# Patient Record
Sex: Female | Born: 1955 | Race: White | Hispanic: No | Marital: Married | Smoking: Never smoker
Health system: Southern US, Community
[De-identification: ages and names within clinical notes are randomized; demographics above are authoritative.]

---

## 1997-12-10 ENCOUNTER — Other Ambulatory Visit: Admission: RE | Admit: 1997-12-10 | Discharge: 1997-12-10 | Payer: Self-pay | Admitting: Obstetrics and Gynecology

## 1998-05-12 ENCOUNTER — Ambulatory Visit (HOSPITAL_COMMUNITY): Admission: RE | Admit: 1998-05-12 | Discharge: 1998-05-12 | Payer: Self-pay | Admitting: Obstetrics and Gynecology

## 1999-05-12 ENCOUNTER — Other Ambulatory Visit: Admission: RE | Admit: 1999-05-12 | Discharge: 1999-05-12 | Payer: Self-pay | Admitting: Obstetrics and Gynecology

## 1999-08-04 ENCOUNTER — Encounter: Payer: Self-pay | Admitting: Obstetrics and Gynecology

## 1999-08-04 ENCOUNTER — Ambulatory Visit (HOSPITAL_COMMUNITY): Admission: RE | Admit: 1999-08-04 | Discharge: 1999-08-04 | Payer: Self-pay | Admitting: Obstetrics and Gynecology

## 1999-10-18 ENCOUNTER — Other Ambulatory Visit: Admission: RE | Admit: 1999-10-18 | Discharge: 1999-10-18 | Payer: Self-pay | Admitting: Obstetrics and Gynecology

## 1999-10-18 ENCOUNTER — Ambulatory Visit (HOSPITAL_COMMUNITY): Admission: RE | Admit: 1999-10-18 | Discharge: 1999-10-18 | Payer: Self-pay | Admitting: Obstetrics and Gynecology

## 1999-10-25 ENCOUNTER — Encounter (INDEPENDENT_AMBULATORY_CARE_PROVIDER_SITE_OTHER): Payer: Self-pay

## 1999-10-25 ENCOUNTER — Ambulatory Visit (HOSPITAL_COMMUNITY): Admission: RE | Admit: 1999-10-25 | Discharge: 1999-10-25 | Payer: Self-pay | Admitting: Obstetrics and Gynecology

## 2001-12-17 ENCOUNTER — Ambulatory Visit (HOSPITAL_COMMUNITY): Admission: RE | Admit: 2001-12-17 | Discharge: 2001-12-17 | Payer: Self-pay | Admitting: Obstetrics and Gynecology

## 2001-12-17 ENCOUNTER — Encounter: Payer: Self-pay | Admitting: Obstetrics and Gynecology

## 2003-08-14 ENCOUNTER — Emergency Department (HOSPITAL_COMMUNITY): Admission: EM | Admit: 2003-08-14 | Discharge: 2003-08-14 | Payer: Self-pay | Admitting: Emergency Medicine

## 2003-08-15 ENCOUNTER — Inpatient Hospital Stay (HOSPITAL_COMMUNITY): Admission: EM | Admit: 2003-08-15 | Discharge: 2003-08-19 | Payer: Self-pay | Admitting: Emergency Medicine

## 2003-08-17 ENCOUNTER — Encounter (INDEPENDENT_AMBULATORY_CARE_PROVIDER_SITE_OTHER): Payer: Self-pay | Admitting: Cardiology

## 2003-09-07 ENCOUNTER — Ambulatory Visit (HOSPITAL_COMMUNITY): Admission: RE | Admit: 2003-09-07 | Discharge: 2003-09-07 | Payer: Self-pay | Admitting: Neurology

## 2003-11-26 ENCOUNTER — Ambulatory Visit (HOSPITAL_COMMUNITY): Admission: RE | Admit: 2003-11-26 | Discharge: 2003-11-26 | Payer: Self-pay | Admitting: Internal Medicine

## 2003-11-26 ENCOUNTER — Encounter (INDEPENDENT_AMBULATORY_CARE_PROVIDER_SITE_OTHER): Payer: Self-pay | Admitting: *Deleted

## 2003-12-29 ENCOUNTER — Ambulatory Visit (HOSPITAL_COMMUNITY): Admission: RE | Admit: 2003-12-29 | Discharge: 2003-12-29 | Payer: Self-pay | Admitting: Obstetrics and Gynecology

## 2004-06-23 ENCOUNTER — Ambulatory Visit: Payer: Self-pay | Admitting: Family Medicine

## 2004-07-15 ENCOUNTER — Ambulatory Visit: Payer: Self-pay | Admitting: Family Medicine

## 2004-09-20 IMAGING — CT CT HEAD W/O CM
1 series · 16 of 26 positions shown, 20 images · non-contrast
Comparison: none

CLINICAL DATA: Follow-up stroke.
 CT OF THE HEAD WITHOUT CONTRAST 
 Routine unenhanced study was performed.  
 Comparison is made with a CT done on 08/17/03.
 There has been further evolution of the previously demonstrated subacute infarct in the right frontal lobe.  The associated hemorrhage has resolved, and there is less brain edema.  There is no evidence of acute stroke extension, recent hemorrhage or extraaxial fluid collection.   There is no longer any mass effect on the frontal horns, which are normal in size.  The visualized paranasal sinuses remain clear.

[Series 2: brain · axial · 0.47mm/px · z∈[+141,+258]mm · 16 of 26 slices shown, 20 images]
[im 2/26  brain]
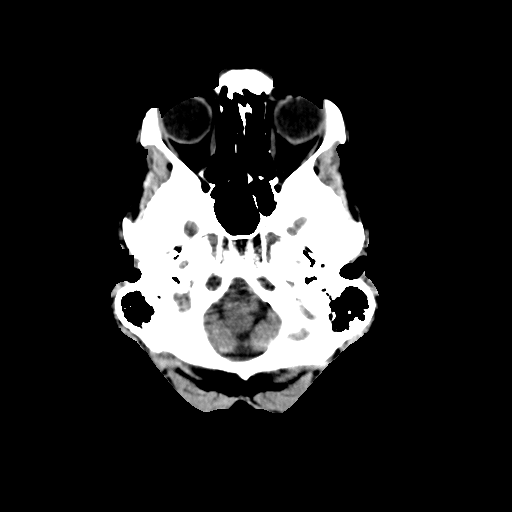
[im 2/26  bone]
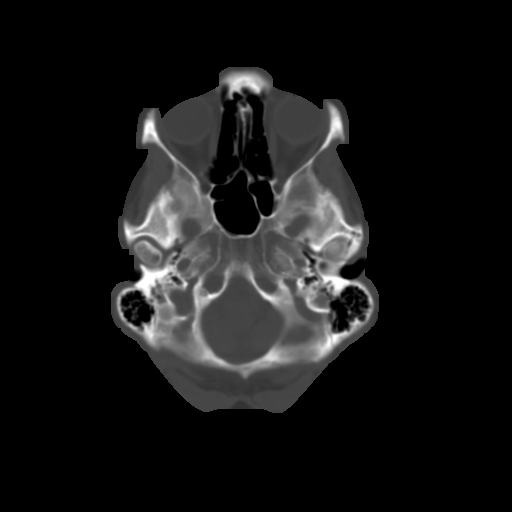
[im 4/26  brain]
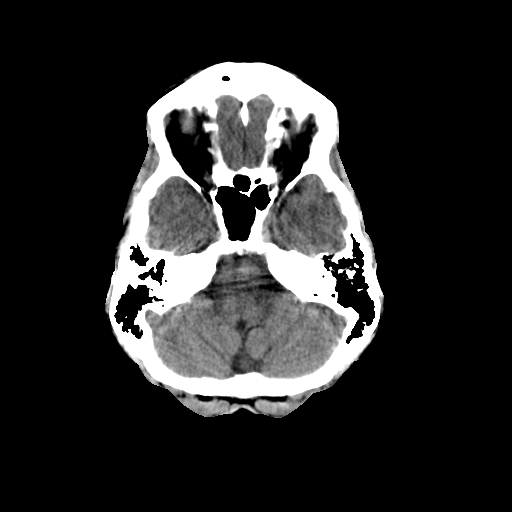
[im 5/26  brain]
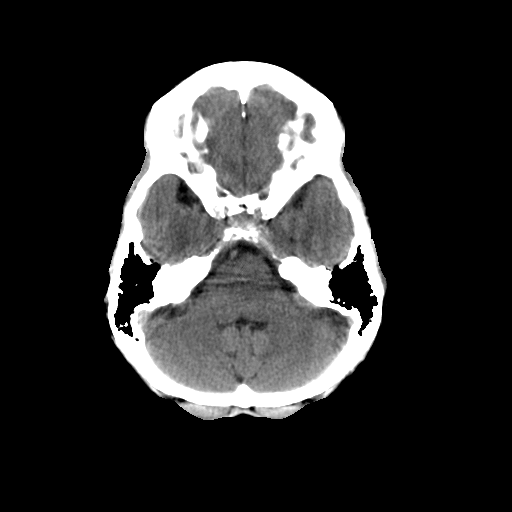
[im 7/26  brain]
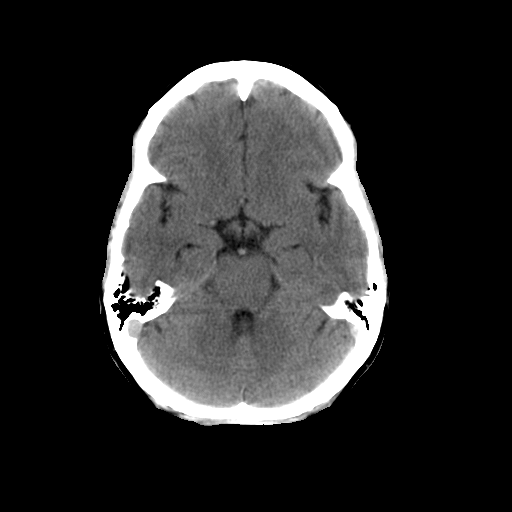
[im 8/26  brain]
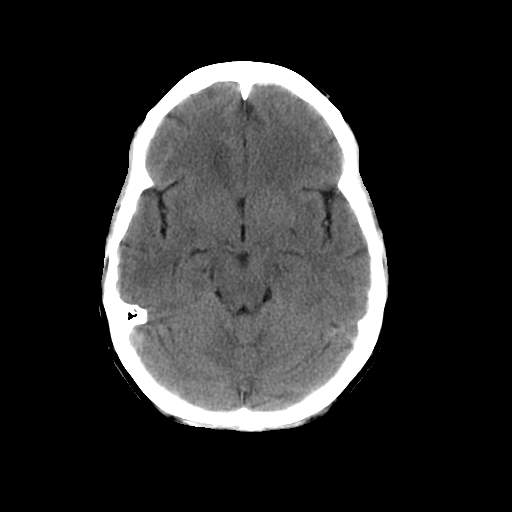
[im 8/26  bone]
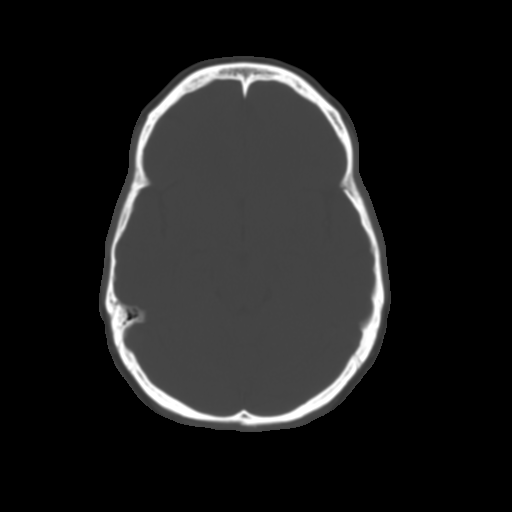
[im 10/26  brain]
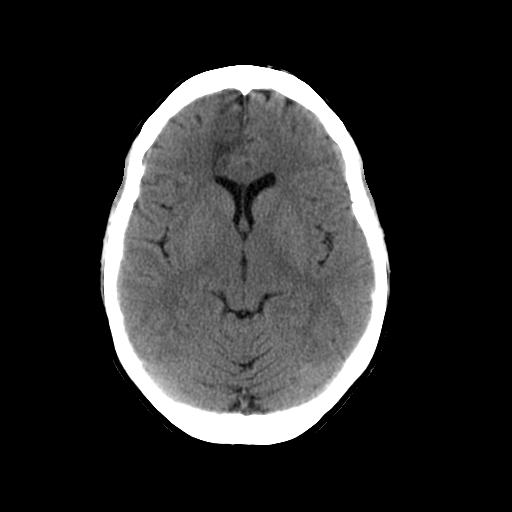
[im 11/26  brain]
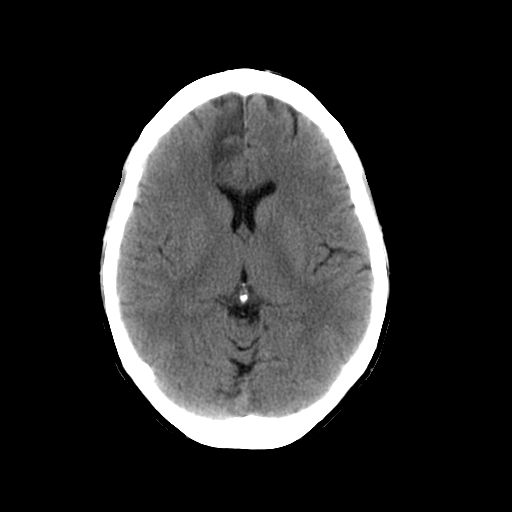
[im 13/26  brain]
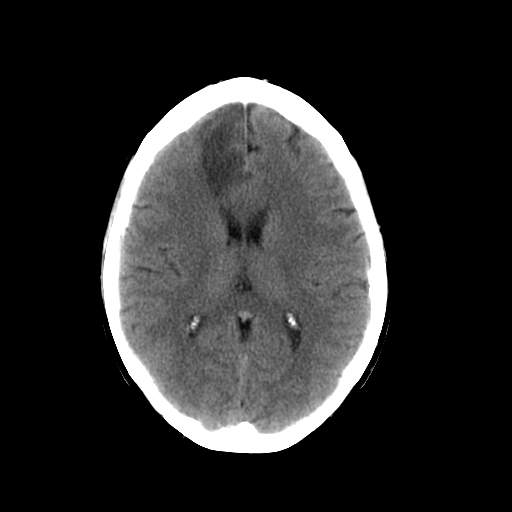
[im 14/26  brain]
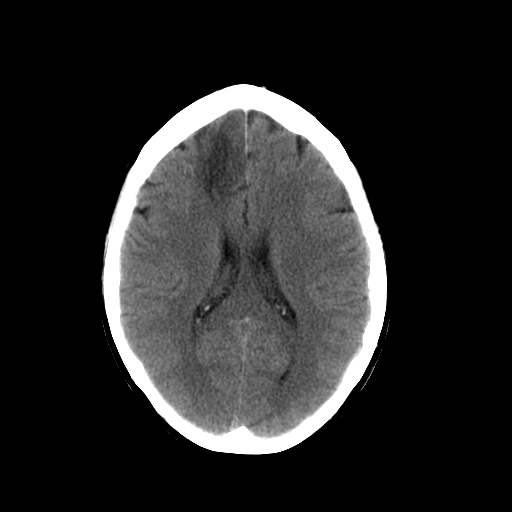
[im 14/26  bone]
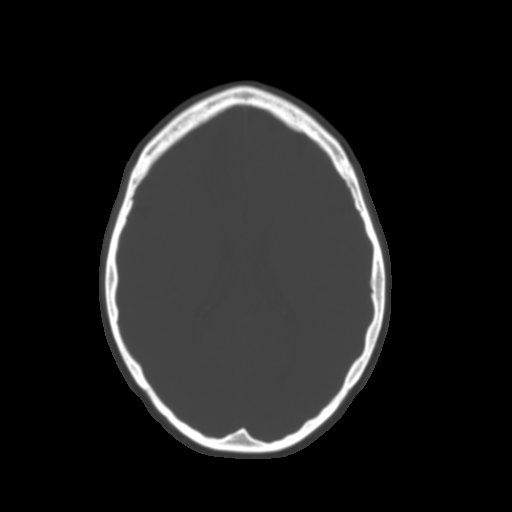
[im 16/26  brain]
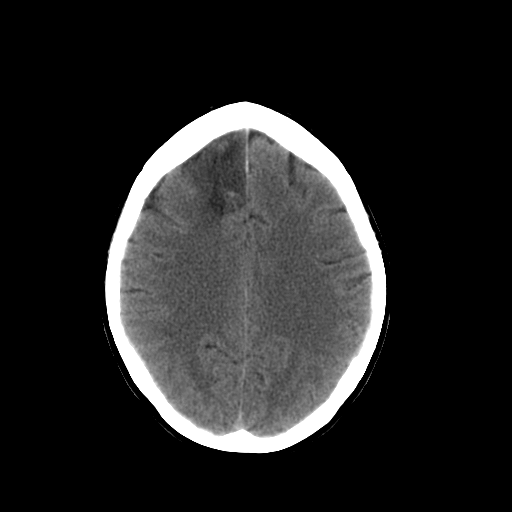
[im 17/26  brain]
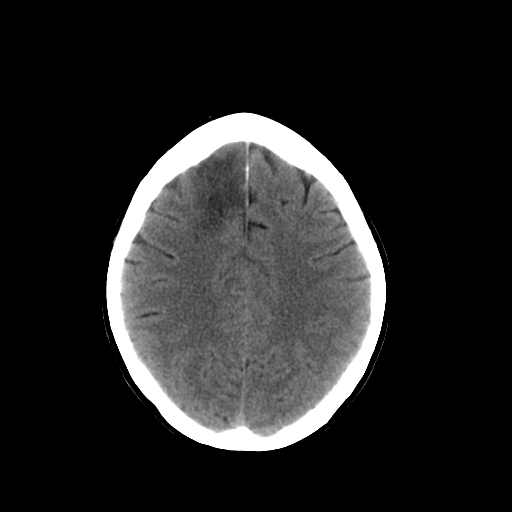
[im 19/26  brain]
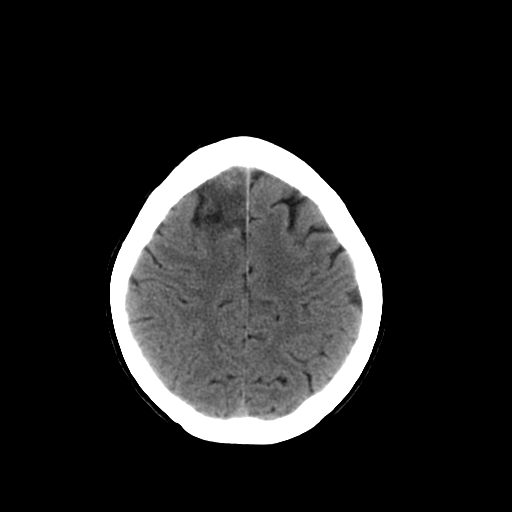
[im 20/26  brain]
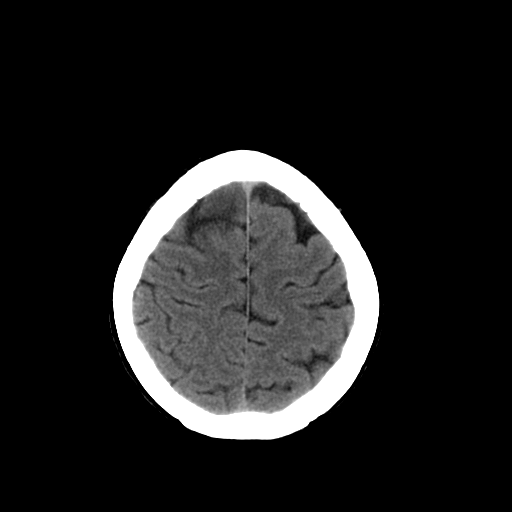
[im 20/26  bone]
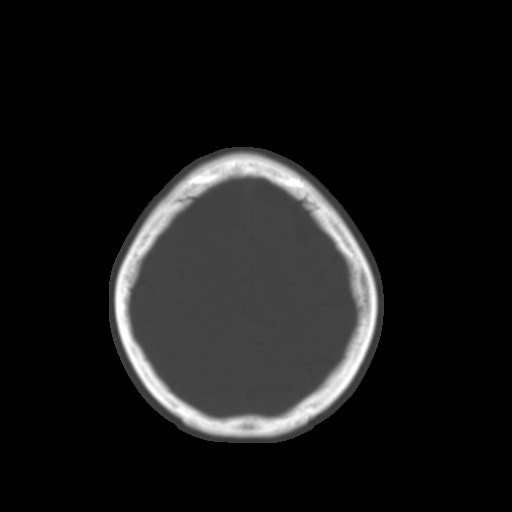
[im 22/26  brain]
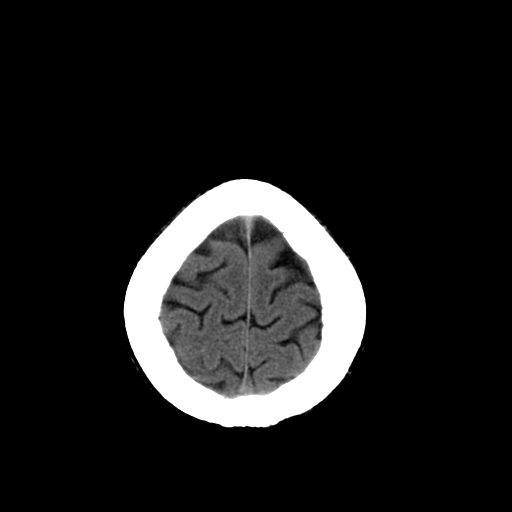
[im 23/26  brain]
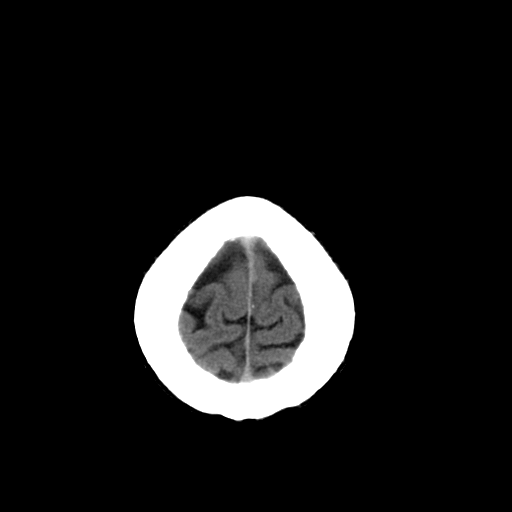
[im 25/26  brain]
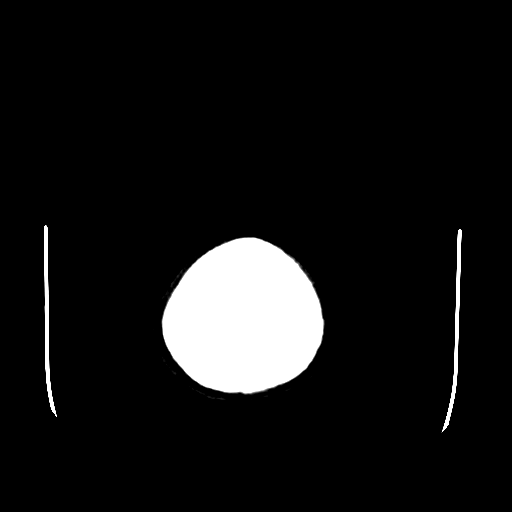

[16 of 26 positions shown; findings below may reference images not displayed]

IMPRESSION: Expected evolution of previously demonstrated subacute stroke in the right frontal lobe. No new findings.

## 2005-01-19 ENCOUNTER — Encounter: Admission: RE | Admit: 2005-01-19 | Discharge: 2005-01-19 | Payer: Self-pay | Admitting: Obstetrics and Gynecology

## 2005-02-01 ENCOUNTER — Other Ambulatory Visit: Admission: RE | Admit: 2005-02-01 | Discharge: 2005-02-01 | Payer: Self-pay | Admitting: Obstetrics and Gynecology

## 2005-02-02 ENCOUNTER — Ambulatory Visit: Payer: Self-pay | Admitting: Family Medicine

## 2005-07-20 ENCOUNTER — Ambulatory Visit: Payer: Self-pay | Admitting: Family Medicine

## 2006-05-22 ENCOUNTER — Ambulatory Visit (HOSPITAL_COMMUNITY): Admission: RE | Admit: 2006-05-22 | Discharge: 2006-05-22 | Payer: Self-pay | Admitting: Obstetrics and Gynecology

## 2007-05-15 ENCOUNTER — Other Ambulatory Visit: Admission: RE | Admit: 2007-05-15 | Discharge: 2007-05-15 | Payer: Self-pay | Admitting: Obstetrics and Gynecology

## 2008-02-06 ENCOUNTER — Ambulatory Visit (HOSPITAL_COMMUNITY): Admission: RE | Admit: 2008-02-06 | Discharge: 2008-02-06 | Payer: Self-pay | Admitting: Obstetrics and Gynecology

## 2010-12-30 NOTE — H&P (Signed)
NAMENEVELYN, MELLOTT NO.:  [id number]   MEDICAL RECORD NO.:  [id number]                   PATIENT TYPE:  INP   LOCATION:  1827                                 FACILITY:  [facility]   PHYSICIAN:  Genene Churn. Love, M.D.                 DATE OF BIRTH:  October 09, 1955   DATE OF ADMISSION:  08/15/2003  DATE OF DISCHARGE:                                HISTORY & PHYSICAL   HISTORY:  This is the first [facility] admission for this 55-year-  old right-handed, white married female from [place], [place],  admitted for evaluation of intractable nausea and vomiting and to rule out  stroke.   HISTORY OF PRESENT ILLNESS:  Ms. Sippel has no history of high blood  pressure, diabetes or known heart disease.  Yesterday, August 14, 2003,  she ate a large meal with steak at noon time.  At about two hours later, she  developed the onset of recurrent nausea and vomiting which has continued  since that time.  She also developed right-sided headache and left-sided  numbness and weakness in the arm and leg and was seen at [facility]  in the emergency room by Dr. Donnetta Hutching, ER physician, and by Dr. Pearlean Brownie,  neurologist.   LABORATORY DATA:  CT scan of the brain was unremarkable.  Laboratory studies  revealed white blood cell count of 6500, hemoglobin 13.8, hematocrit 39.9,  platelet count 253,000 K.  Sodium 135, potassium 3.6, chloride 102, BUN 18,  glucose 107, pH 7.410, pCO2 40.0.  Prothrombin time was 12.7, PTT was 24.  CK-MB was 2.5, myoglobin POC 84.5.   She was discharged from the emergency room to follow up with MRI study as an  outpatient.  This day, her husband has called me twice indicating she was  continuing to have nausea and vomiting despite the use of weak tea, Gator  Aid and Phenergan suppositories.  I did ask them to come to the emergency  room for further evaluation.   She has no history of alcohol or drug abuse.  She does have a known  past  history of anxiety.  There has been no head or neck trauma.   PAST MEDICAL HISTORY:  Significant for anxiety, D&C in 2002, hypertension  during her pregnancy.   MEDICATIONS:  Zoloft 25 mg daily.   SOCIAL HISTORY:  She has been educated through 13 years of school and went  through one year of community college.  She works for a [employer].  She does not smoke cigarettes or drink alcohol.  She is married and has one  daughter age 87 living and well.   ALLERGIES:  She has no known drug allergies.   FAMILY HISTORY:  Her mother died at age 71 and her father age 36 with  depression and suicide.  She has one brother who is 10, living and well.  MEDICAL PHYSICIANS:  1. Dr. Artist Pais, [place].  2. Dr. Avelino Leeds in [place], [place].   PHYSICAL EXAMINATION:  GENERAL:  A well-developed white female.  She was  alert and oriented x3.  Followed one, two and three-step commands.  VITAL SIGNS:  Blood pressure right and left arm 150/83, temperature 98.6.  NEUROLOGIC:  On neuro examination revealed visual fields full, disks flat,  extraocular movements are full, corneal is present.  No facial asymmetry.  Hearing is intact.  Air conduction greater than bone conduction.  Tongue  midline, uvula midline.  Gag is present.  Sternocleidomastoid and trapezius  testing normal.  Motor examination:  Good strength of the upper and lower  extremities without any evidence of proximal, pronator or distal drift.  Coordination testing was normal.  Sensory examination was intact.  Pinprick  and light touch, or distal coordination testing.  Deep tendon reflexes 1 and  2+, plantar responses are downgoing.  She has a right ptosis.  GENERAL EXAMINATION:  Tympanic membranes clear.  LUNGS:  Clear to auscultation.  HEART:  Revealed no murmurs.  ABDOMEN:  Bowel sounds were normal.  There is no enlargement of the liver,  spleen or kidneys.   LABORATORY DATA:  Revealed a white blood cell  count of 6900, hemoglobin  13.9, hematocrit 40.0, platelets 230,000.  Sodium 133, potassium low at 3.0.  Chloride 99, CO2 content 25.  BUN 10, creatinine 0.7.  SGOT is high at 100.  SGPT high at 143, alkaline phosphatase normal at 63, total bilirubin 0.7.   IMPRESSION:  1. Recurrent nausea and vomiting, code unknown.  2. History of left sided weakness and numbness, code 342.10 and 782.0.  3. Elevated liver function tests, code 790.6, rule out hepatitis.  4. Hypokalemia, code 276.8.   PLAN:  Admit the patient for further evaluation.                                                Genene Churn. Sandria Manly, M.D.    JML/MEDQ  D:  08/15/2003  T:  08/15/2003  Job:  [id number]

## 2010-12-30 NOTE — Cardiovascular Report (Signed)
Patricia Hunt, LATTERELL NO.:  [id number]   MEDICAL RECORD NO.:  [id number]                   PATIENT TYPE:  INP   LOCATION:  3033                                 FACILITY:  [facility]   PHYSICIAN:  Charlton Haws, M.D.                  DATE OF BIRTH:  [birth year]   DATE OF PROCEDURE:  08/18/2003  DATE OF DISCHARGE:                              CARDIAC CATHETERIZATION   PROCEDURE:  TEE   INDICATIONS:  MCA stroke, rule out source of embolus.   PROCEDURE:  The patient was somewhat difficult to sedate.  She required 10  of Versed and 100 of fentanyl.  Using digital technique,  an OmniPlane probe  was advanced into the distal esophagus without incident.  Transgastric  imaging revealed normal LV function with an EF of 60%.  There was no mural  clot.  Mitral, aortic, tricuspid, and pulmonary valves were normal.  There  were no vegetations or strands.   There was trace MR.  Left atrial appendage was well-visualized.  There was  no clot or spontaneous contrast in the atria.  The atrial septum was intact  by color flow.  Bubble study was negative for shunt.  There was no  significant aortic debris.   IMPRESSION:  1. No source of embolus.  2. Negative bubble study.  3. Essentially normal transesophageal echocardiogram with trace mitral     regurgitation.                                               Charlton Haws, M.D.    PN/MEDQ  D:  08/18/2003  T:  08/18/2003  Job:  [id number]   cc:   E[facility]   Genene Churn. Love, M.D.  [address]  [address]  [place]  [place] [address]  Fax: [phone]

## 2010-12-30 NOTE — Discharge Summary (Signed)
Patricia Hunt, Patricia Hunt NO.:  [id number]   MEDICAL RECORD NO.:  [id number]                   PATIENT TYPE:  INP   LOCATION:  3033                                 FACILITY:  [facility]   PHYSICIAN:  Genene Churn. Love, M.D.                 DATE OF BIRTH:  04-04-1956   DATE OF ADMISSION:  08/15/2003  DATE OF DISCHARGE:  08/19/2003                                 DISCHARGE SUMMARY   PATIENT ADDRESS:  [address], [place], [place] [address]   This was the first [facility] admission for this 55 year old, right-  handed, white, married female from [place], [place], admitted from  the emergency room for evaluation of intractable nausea and vomiting and to  rule out stroke.   HISTORY OF PRESENT ILLNESS:  This patient was in her usual state of health.  On August 14, 2003, after a large meal of steak, developed recurrent  nausea and vomiting with right-sided headache and left-sided numbness and  weakness involving the arm and leg.  There was no associated double vision,  hiccups, chest pain, or palpitations.  Her neurologic symptomatology lasted  approximately 10 minutes, but she continued to have nausea and vomiting.  She was seen in the [facility] Emergency Room by Dr. Pearlean Brownie, and a  CT scan of the brain was obtained which was unremarkable.  She was to have  an MRI as an outpatient, but leaving the emergency room about 1 a.m. on  August 15, 2003, she continued to have nausea and vomiting and returned to  the emergency room on the afternoon of August 15, 2003, and was admitted.  She was unable to keep foods down. She tried weak tea, Gator Ade, and  Phenergan suppositories.   She has a history of being exposed to someone with a viral illness  approximately three weeks prior to admission.  She also had episodes of  coughing paroxysms during the weeks prior to admission.  Otherwise, she had  no known history of high blood pressure,  diabetes, heart disease, head or  neck trauma, drug or alcohol exposure.  She has no known history of IV drug  use.   PAST MEDICAL HISTORY:  1. Anxiety.  2. D&C in the year 2002.  3. Hypertension during her pregnancy.   MEDICATIONS:  Her medication at time of admission was Zoloft 25 mg daily.   HABITS:  She does not smoke cigarettes.  She does not drink alcohol.   ALLERGIES:  She had no known history of allergies.   PHYSICAL EXAMINATION:  GENERAL: Examination at time of admission revealed a  well-developed white female.  VITAL SIGNS:  Blood pressure right and left arm 150/83 and temperature of  98.6.  NEUROLOGIC:  She was alert and oriented x 3.  She was inattentive, has short  attention span, and difficulty focusing, and at times would follow commands  but  only do so for a short period of time.  Her cranial nerve examination  was unremarkable.  Her motor examination revealed good strength in the upper  and lower extremities.  Her rapid alternating movement skills were  within  normal limits.  He sensory examination was intact.  Focally, she had  evidence of a right ptosis.  HEENT/CHEST/LUNGS:  Unremarkable.  ABDOMEN:  No enlargement of liver or spleen.   LABORATORY DATA:  White blood cell count 6900, hemoglobin 13.9, hematocrit  40.0, platelet count 230,000.  Differential shows 73% polys, 16% lymphs, 10%  monocytes, 1% eosinophils.  PT 13.4, INR 1.0, PTT 25.  Initial serum sodium  was 132, potassium 3.0, chloride 99, CO2 content 25, glucose 118, BUN 10,  creatinine 0.7, calcium 9.0.  Total protein 7.6, albumin 4.0 with a repeat  of 3.5.  AST 100, ALT 143 with repeat AST of 95 and repeat ALT of 135.  Alkaline phosphatase 63 and 58.  Total bilirubin 0.7 and 0.6.  Direct  bilirubin 0.2, indirect bilirubin 0.4.  Magnesium 2.1.  Amylase 139, and  lipase was also 139.  Homocysteine level was 7.89.  CK 69, CK-MB 1.3.  Her  total cholesterol was 137 with an HDL of 58, and LDL of 57.   Her hepatitis  screen was positive for hepatitis C, and this was repeated.  A urinalysis  was unremarkable.  A drug screen revealed no detected cocaine, amphetamines,  cannaboids, opiates, barbiturates, or tricyclics.  Repeat electrolytes in  hospital were potassium 3.4 and then potassium 3.9.  ANA was negative.  Lupus anticoagulant was negative.  Her PT was 13.4, INR 1.0, PTT 25.  Estimated sed rate was 25.  Protein-C and Protein-S total and functional are  pending.  An anticardiolipin antibody is pending.   Doppler studies revealed 60 to 80% ICA stenosis on the right, no evidence of  any stenosis on the left, but there was a lot of tortuosity, and vertebral  flow was thought to be antegrade.   CT scan of the brain in the hospital showed evolution of a right frontal  lobe infarction associated with a small amount of cortical hemorrhage.   CT scan of the abdomen and pelvis revealed prominent peripancreatic lymph  nodes of questionable etiology, no evidence of focal inflammatory process or  fluid collection, question of gallbladder sludge noted.   CT scan of the pelvis showed no evidence of abnormal fluid collection or  inflammatory process.  A slightly prominent sized uterus, ovarian cyst  follicles were noted.   An MRI study of the brain with and without contrast showed a moderate to  large size acute hemorrhagic right frontal lobe infarction with associated  local mass effect.  Because of this, a followup CT scan was obtained.  There  were some problems to secure ophthalmic veins of questionable etiology.   MRA of the circle of Willis revealed a motion-degraded examination.   Contrast and head MR angiogram of the extracranial circulation revealed  prominent kink of the proximal internal carotid arteries bilaterally at the  level of a kink site, there may have been some focal deficit  There was  tortuosity and irregularity of the proximal vertebral arteries with the left vertebral  artery dominant.  The distal aspect of the right vertebral artery  was poorly delineated.  There was some irregularity of the proximal  vertebral arteries which may be related to atherosclerotic type changes.   A cerebral arteriogram was performed in the hospital by Dr.  Deveshwar which  showed no evidence of aneurysm, no evidence of dissection, no evidence of  arterial venous malformation present.  Full report was to follow.   A 2-D echocardiogram was unremarkable.   A transesophageal echocardiogram by Dr. Charlton Haws on August 18, 2003, was  unremarkable with negative evidence of a patent foramen ovale, negative  bubble study, negative LAA for clot, negative aortic debris, and negative  shunt noted.   Telemetry in hospital showed normal sinus rhythm.   HOSPITAL COURSE:  Initial CT scan the night prior to admission had been  unremarkable.  She returned to the emergency room and was admitted for  further evaluation.  MRI of the brain showed evidence of a hemorrhagic  infarction in the right ACA distribution.  MRA raised the question of an  aneurysm, but this was not found by cerebral arteriography nor was any  evidence of vasculitis, AVM, dissection, etc.  The patient tolerated the  procedure well.   It was noted that she had elevated liver function tests which were repeated  and again noted to be elevated.  SGOT and SGPT in 100 to 150 range.  There  was a positive test for hepatitis C with other hepatitis screen negative.  A  hepatitis C viral RNA is pending.   During her hospitalization, she had telemetry which showed normal sinus  rhythm.  She had borderline hypertension at times.  She had a mild low-grade  temperature at times up to 102.6 on August 16, 2003, but subsequently  unremarkable.  This was thought to be secondary to the hemorrhage.  Her  urinalysis was unremarkable except for elevated ketones probably related to  nausea and vomiting at time of admission.   The  patient was seen by OT and PT with evaluations placed on the chart.  OT  one-time evaluation was completed and felt that the patient might benefit  from OT as an outpatient.  PT did not feel that followup was needed.  Cause  of the patient's hemorrhagic conversion of ACA stroke was unclear.  It was  noted that she had recurrent paroxysms of coughing which made the question  of a right-to-left heart shunt possible, but nothing was noted on the  transthoracic echocardiogram or the TEE.  She was found to have elevated  liver function tests; whether or not this represented hepatitis C is still  unclear.   The patient did well in the hospital with resolution of mild fever.  She did  have a slight elevation in her blood pressure at time to 155/85 range.   IMPRESSION:  1. 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 2. Elevated liver function tests and possible hepatitis C (Code 573.3).  3. Borderline hypertension (Code 796.2).  4. History of anxiety treated with Zoloft (Code 300.00).  5. History of dilatation and curettage in the year 2002.   PLAN:  1. Discharge the patient on Zoloft 25 mg daily.  2. No driving, no work for three weeks.  3. Return to me in two weeks for followup evaluation of her protein-C,     protein-S, anticardiolipin antibody, and     hepatitis C RNA viral load study.  Of note, above other studies in the     hospital included ANA, estimated sed rate, antithrombin III, lupus     anticoagulant, all of which were unremarkable.   The patient is discharged  improved from prehospital status.                                                Genene Churn. Sandria Manly, M.D.    JML/MEDQ  D:  08/19/2003  T:  08/19/2003  Job:  [id number]   cc:   Dr. Aleda Grana, [place]   Artist Pais, M.D.  [address], [address]  [place]  [place] [address]  Fax: [phone]

## 2010-12-30 NOTE — Op Note (Signed)
[facility]  Patient:    Patricia Hunt, Patricia Hunt                       MRN: [id number] Proc. Date: 10/25/99 Adm. Date:  [id number] Attending:  Madelyn Flavors CC:         Beather Arbour. Thomasena Edis, M.D.                           Operative Report  PREOPERATIVE DIAGNOSIS:       Ablated ovum with a gestational sac of nine weeks  estimated gestational age.  POSTOPERATIVE DIAGNOSIS:      Same.  PROCEDURE:                    Dilatation and evacuation.  SURGEON:                      Beather Arbour. Thomasena Edis, M.D.  ESTIMATED BLOOD LOSS:         Minimal.  FLUIDS:                       Crystalloid 1450 cc.  ANESTHESIA:                   Monitored anesthesia care plus 10 cc 1% Lidocaine  paracervical block.  COMPLICATIONS:                None.  DRAINS:                       None.  DESCRIPTION OF OPERATION:     The patient was brought to the operating room, identified on the operating table.  After the patient was adequate sedated using monitored anesthesia care, she was placed in the dorsal lithotomy position and prepped and draped in the usual fashion.  The bladder was straight catheterized for clear yellow urine.  An examination under anesthesia revealed the uterus to be retroverted approximately nine to ten weeks size without any adnexal masses palpated.  The posterior lip of the cervix was infiltrated with 1 cc of 1% Lidocaine and grasped with a single toothed tenaculum.  The remaining 9 cc of 1% Lidocaine were infused for paracervical block.  The cervix was very gently dilated up to a # 29 Pratt dilator.  Dilatation proceeded very gently and smoothly to decrease the risk of uterine perforation.  The # 9 curved suction curette was placed and the uterus was systematically curetted in a clockwise fashion with copious tissue obtained.  Pitocin was placed in the IV fluid and infused throughout the case.  There was noted to be minimal bleeding.  The sharp curette was  then sed to explore the endometrial cavity and only a small amount of tissue remained. he curve suction curette was again placed and a small amount of additional tissue as obtained.  Uterus was again explored with the sharp curette and there was noted to be a good ______ heard all around.  At that point the uterus was noted to be well contracted, approximately six to seven weeks size and there was noted to be no bleeding from the tenaculum site.  The patient tolerated procedure well without  apparent complications.  Was transferred to the recovery room in stable condition after instrument, sponge, needle counts were correct.  The patient received discharge instruction sheet and was urged to return to the  office in two to three weeks for postoperative visit.  She is to call for problems.  She was given prescriptions for Doxycycline 100 mg p.o. b.i.d. for seven days and methargen 0.2 mg p.o. q.6h. for the next two days.  Urged to take ibuprofen 400-600 mg q.6h. p.r.n. pain.  The patient did receive RhoGAM since she is O- and had some spotting. She received RhoGAM on October 18, 1999, thus RhoGAM is not indicated. DD:  10/25/99 TD:  10/25/99 Job: [id number]

## 2010-12-30 NOTE — Consult Note (Signed)
Patricia Hunt, Patricia Hunt                          ACCOUNT NO.:  [id number]   MEDICAL RECORD NO.:  [id number]                   PATIENT TYPE:  EMS   LOCATION:  [facility]                                 FACILITY:  [facility]   PHYSICIAN:  Pramod P. Pearlean Brownie, MD                 DATE OF BIRTH:  [birth year]   DATE OF CONSULTATION:  DATE OF DISCHARGE:                                   CONSULTATION   CONSULTING PHYSICIAN:  Pramod P. Pearlean Brownie, M.D.   REFERRING PHYSICIAN:  Dr. Donnetta Hutching.   REASON FOR REFERRAL:  TIA.   HISTORY OF PRESENT ILLNESS:  [name] is a pleasant 55 year old lady,  with no significant vascular risk factors, who developed sudden onset of  left body numbness at about 4:30 p.m. following Patricia Hunt lunch.  The patient  states that Patricia Hunt felt nauseous and numbness involving the left upper and  lower extremities.  Patricia Hunt is unable to recall if the numbness effected Patricia Hunt  face or the rest of the left side of the body.  Patricia Hunt noticed that Patricia Hunt also  felt Patricia Hunt left leg was strange, and Patricia Hunt had trouble bearing weight on it.  Patricia Hunt, however, was able to walk and did not fall.  Patricia Hunt symptoms improved  almost completely within ten minutes, but Patricia Hunt still had some residual left  hand strange feeling which persisted, prompting Patricia Hunt to come to the emergency  room.  Since arrival here, Patricia Hunt has continued to improve, and Patricia Hunt feels Patricia Hunt  is almost back to Patricia Hunt baseline.  Patricia Hunt had some right frontal headache at the  beginning of Patricia Hunt symptoms which did not last long.  Patricia Hunt has no prior history  of stroke or TIA or any significant risk factors in the form of  hypertension, diabetes, smoking, hyperlipidemia, or a family history.  Patricia Hunt  has a past medical history of significant anxiety and depression, and Patricia Hunt  does admit that yesterday Patricia Hunt was quite upset with one of Patricia Hunt customers, and  Patricia Hunt got into a tiff with them, and felt bad about it all day.  Patricia Hunt has no  other past medical history.   CURRENT MEDICATIONS:  Zoloft 25 mg a  day.   MEDICATION ALLERGIES:  None.   PAST SURGICAL HISTORY:  None.   SOCIAL HISTORY:  Patricia [place] Patricia Hunt husband and  they run a oil supplying business.  Patricia Hunt does not smoke or drink.   REVIEW OF SYSTEMS:  Not significant for recent fever, loss of weight, cough  or diarrhea.  Patricia Hunt had a cold a few weeks ago.  Patricia Hunt does have chronic  anxiety.  Patricia Hunt is not having currently any chest pain, shortness of breath,  cough or diarrhea.   PHYSICAL EXAMINATION:  GENERAL:  Reveals a pleasant young lady who appears  extremely anxious and tense.  VITAL SIGNS:  Patricia Hunt is afebrile.  Pulse rate is 80 per minute  regular,  respiratory rate 16 per minute.  Distal pulses are well felt.  HEENT:  Head is atraumatic.  ENT exam is unremarkable.  NECK:  Supple without bruit.  CARDIAC:  No murmur or gallop.  LUNGS:  Clear to auscultation.  NEUROLOGIC:  The patient is awake, alert, oriented x 3 with normal speech  and language function.  There is no aphasia, praxia, or dysarthria.  Pupils  are equal, reactive to light and accommodation.  Eye movements are full  range without nystagmus.  Visual acuity and fields are adequate.  Face is  symmetric.  Palate moves normally.  The tongue is midline.  Motor system  exam reveals symmetric upper and lower extremity strength, tone, reflexes,  coordination, and sensation.  Patricia Hunt has intact touch to pin prick and position  sensation.  Patricia Hunt walks with a fairly steady gait.  Patricia Hunt can stand on either  foot unsupported on heels and toes.   DATA REVIEW:  EKG shows no acute ischemic findings.   CAT scan of the head is unremarkable without any significant abnormalities  seen.  There is no evidence of acute stroke.   IMPRESSION:  A 55 year old lady with no significant vascular risk factors  with left sided paresthesias which were transient and have resolved.  Patricia Hunt  did have significant underlying anxiety.  I doubt that this represents  ____________ for a  transient ischemic attack.   PLAN:  I would recommend increasing aspirin from 81 to 325 mg a day.  Outpatient MRI scan of the brain and MRA of the brain and carotid artery  studies.  Followup with me in my office in a few weeks for discussion of the  results of the above tests.  If Patricia Hunt symptoms recur or if Patricia Hunt develops new  symptoms, I would advise Patricia Hunt to come to the emergency room right away or  call me.  Thank you for this referral.                                               Pramod P. Pearlean Brownie, MD    PPS/MEDQ  D:  08/15/2003  T:  08/15/2003  Job:  [id number]

## 2010-12-30 NOTE — H&P (Signed)
[facility]  Patient:    Patricia Hunt, Patricia Hunt                       MRN: [id number] Adm. Date:  [id number] Attending:  Madelyn Flavors CC:         [facility]                         History and Physical  HISTORY OF PRESENT ILLNESS:   The patient is a 55 year old, gravida 3, para 1-0-1-1, white female recently seen for a new OB visit.  At that time she was found to have manifested some spotting in the first trimester and an ultrasound was performed.  Ultrasound showed a 9-week gestational sac with no fetal pole and no cardiac activity seen.  Diagnosis of blighted ovum was made.  The patient was urged to undergo dilatation and evacuation.  The risks of surgery including anesthetic complications, hemorrhage, infection, and damage to adjacent structures including bladder, bowel, blood vessels, and ureters were discussed with the patient. She was made aware of the risk of incomplete procedure and the risk of uterine perforation which could result in overwhelming lifethreatening hemorrhage requiring emergent hysterectomy or uterine perforation which could result in bowel damage  which could result in colostomy or overwhelming lifethreatening peritonitis. She expressed understanding of and acceptance of these risks.  PAST MEDICAL HISTORY:         Significant for a vaginal delivery in 1997.  That  pregnancy was complicated by preeclampsia.  History of of a TAB in 1973.  FAMILY HISTORY:               History of an MI in maternal grandfather. History of chronic hypertension in the patients father.  ALLERGIES:                    No known drug allergies.  MEDICATIONS:                  Nestab.  PHYSICAL EXAMINATION:  HEENT:                        Normal.  NECK:                         Supple without thyromegaly.  LUNGS:                        Clear to auscultation.  HEART:                        Regular rate and rhythm.  ABDOMEN:                       Soft, nontender, and no hepatosplenomegaly.  PELVIC:                       Reveals the uterus to be approximately 7 weeks and no adnexal mass palpated.  RECTAL:                       Confirmatory, no mass.  ASSESSMENT:                   The patient is a 55 year old, gravida 3, para 1-0-1-1, white female with blighted ovum diagnosed and admitted  for a dilatation and evacuation.  The risks have been explained to the patient.  She expresses understanding of and acceptance of these risks. DD:  10/25/99 TD:  10/25/99 Job: [id number]

## 2016-03-22 ENCOUNTER — Other Ambulatory Visit: Payer: Self-pay

## 2016-03-22 DIAGNOSIS — B182 Chronic viral hepatitis C: Secondary | ICD-10-CM

## 2016-03-22 LAB — PROTIME-INR
INR: 1.2 — ABNORMAL HIGH
Prothrombin Time: 12.5 s — ABNORMAL HIGH (ref 9.0–11.5)

## 2016-03-22 LAB — CBC WITH DIFFERENTIAL/PLATELET
Basophils Absolute: 54 cells/uL (ref 0–200)
Basophils Relative: 1 %
Eosinophils Absolute: 216 cells/uL (ref 15–500)
Eosinophils Relative: 4 %
HCT: 31.5 % — ABNORMAL LOW (ref 35.0–45.0)
Hemoglobin: 11 g/dL — ABNORMAL LOW (ref 11.7–15.5)
Lymphocytes Relative: 23 %
Lymphs Abs: 1242 cells/uL (ref 850–3900)
MCH: 34.1 pg — ABNORMAL HIGH (ref 27.0–33.0)
MCHC: 34.9 g/dL (ref 32.0–36.0)
MCV: 97.5 fL (ref 80.0–100.0)
MPV: 9 fL (ref 7.5–12.5)
Monocytes Absolute: 594 cells/uL (ref 200–950)
Monocytes Relative: 11 %
Neutro Abs: 3294 cells/uL (ref 1500–7800)
Neutrophils Relative %: 61 %
Platelets: 198 10*3/uL (ref 140–400)
RBC: 3.23 MIL/uL — ABNORMAL LOW (ref 3.80–5.10)
RDW: 13.1 % (ref 11.0–15.0)
WBC: 5.4 10*3/uL (ref 3.8–10.8)

## 2016-03-23 LAB — COMPREHENSIVE METABOLIC PANEL
ALT: 148 U/L — ABNORMAL HIGH (ref 6–29)
AST: 217 U/L — ABNORMAL HIGH (ref 10–35)
Albumin: 3.9 g/dL (ref 3.6–5.1)
Alkaline Phosphatase: 120 U/L (ref 33–130)
BUN: 24 mg/dL (ref 7–25)
CO2: 23 mmol/L (ref 20–31)
Calcium: 8.9 mg/dL (ref 8.6–10.4)
Chloride: 95 mmol/L — ABNORMAL LOW (ref 98–110)
Creat: 1.28 mg/dL — ABNORMAL HIGH (ref 0.50–0.99)
Glucose, Bld: 87 mg/dL (ref 65–99)
Potassium: 3.5 mmol/L (ref 3.5–5.3)
Sodium: 128 mmol/L — ABNORMAL LOW (ref 135–146)
TOTAL PROTEIN: 8.3 g/dL — AB (ref 6.1–8.1)
Total Bilirubin: 0.9 mg/dL (ref 0.2–1.2)

## 2016-03-23 LAB — HEPATITIS B CORE ANTIBODY, TOTAL: HEP B C TOTAL AB: REACTIVE — AB

## 2016-03-23 LAB — HEPATITIS B SURFACE ANTIBODY,QUALITATIVE: Hep B S Ab: POSITIVE — AB

## 2016-03-23 LAB — HEPATITIS B SURFACE ANTIGEN: Hepatitis B Surface Ag: NEGATIVE

## 2016-03-23 LAB — HIV ANTIBODY (ROUTINE TESTING W REFLEX): HIV: NONREACTIVE

## 2016-03-23 LAB — HEPATITIS A ANTIBODY, TOTAL: Hep A Total Ab: REACTIVE — AB

## 2016-03-23 LAB — AFP TUMOR MARKER: AFP-Tumor Marker: 5.8 ng/mL

## 2016-03-24 LAB — LIVER FIBROSIS, FIBROTEST-ACTITEST
ALT: 147 U/L — ABNORMAL HIGH (ref 6–29)
Alpha-2-Macroglobulin: 339 mg/dL — ABNORMAL HIGH (ref 106–279)
Apolipoprotein A1: 147 mg/dL (ref 101–198)
Bilirubin: 0.8 mg/dL (ref 0.2–1.2)
Fibrosis Score: 0.75
GGT: 125 U/L — ABNORMAL HIGH (ref 3–65)
Haptoglobin: 76 mg/dL (ref 43–212)
Necroinflammat ACT Score: 0.83
Reference ID: 1601176

## 2016-03-31 LAB — HCV RNA,LIPA RFLX NS5A DRUG RESIST

## 2016-03-31 LAB — HCV RNA, QUANT REAL-TIME PCR W/REFLEX
HCV RNA, PCR, QN (LOG): 5.21 {Log_IU}/mL — AB
HCV RNA, PCR, QN: 162000 IU/mL — ABNORMAL HIGH

## 2016-04-02 LAB — HCV RNA NS5A DRUG RESISTANCE

## 2016-04-05 ENCOUNTER — Ambulatory Visit (INDEPENDENT_AMBULATORY_CARE_PROVIDER_SITE_OTHER): Payer: BLUE CROSS/BLUE SHIELD | Admitting: Internal Medicine

## 2016-04-05 ENCOUNTER — Encounter: Payer: Self-pay | Admitting: Internal Medicine

## 2016-04-05 DIAGNOSIS — I10 Essential (primary) hypertension: Secondary | ICD-10-CM | POA: Diagnosis not present

## 2016-04-05 DIAGNOSIS — B182 Chronic viral hepatitis C: Secondary | ICD-10-CM | POA: Diagnosis not present

## 2016-04-05 DIAGNOSIS — I619 Nontraumatic intracerebral hemorrhage, unspecified: Secondary | ICD-10-CM | POA: Diagnosis not present

## 2016-04-05 DIAGNOSIS — M810 Age-related osteoporosis without current pathological fracture: Secondary | ICD-10-CM | POA: Diagnosis not present

## 2016-04-05 MED ORDER — LEDIPASVIR-SOFOSBUVIR 90-400 MG PO TABS: 1.0000 | ORAL_TABLET | Freq: Every day | ORAL | 2 refills | Status: DC

## 2016-04-05 NOTE — Progress Notes (Signed)
[facility]   CC: consideration for treatment for chronic hepatitis C  HPI:  +Patricia Hunt is a 60 y.o. female who presents for initial evaluation and management of chronic hepatitis C.  Patient tested positive about 10 years ago after a stroke. Hepatitis C-associated risk factors present are: none. Patient denies history of blood transfusion, history of clotting factor transfusion, intranasal drug use, IV drug abuse, multiple sexual partners, renal dialysis, sexual contact with person with liver disease, tattoos. Patient has had other studies performed. Results: hepatitis C RNA by PCR, result: positive. Patient has not had prior treatment for Hepatitis C. Patient does not have a past history of liver disease. Patient does not have a family history of liver disease. Patient does not  have associated signs or symptoms related to liver disease.  Labs reviewed and confirm chronic hepatitis C with a positive viral load.   Records reviewed from previous biopsy of F2 in 2005.        Patient does have documented immunity to Hepatitis A. Patient does have documented immunity to Hepatitis B.    Review of Systems:   Constitutional: negative for malaise and anorexia Gastrointestinal: negative for diarrhea Musculoskeletal: negative for myalgias and arthralgias All other systems reviewed and are negative      No past medical history on file.  Prior to Admission medications   Medication Sig Start Date End Date Taking? Authorizing Provider  FLUoxetine (PROZAC) 20 MG tablet Take 20 mg by mouth 2 (two) times daily.   Yes Historical Provider, MD  nadolol (CORGARD) 20 MG tablet Take 20 mg by mouth daily.   Yes Historical Provider, MD  valsartan-hydrochlorothiazide (DIOVAN-HCT) 320-12.5 MG tablet Take 1 tablet by mouth daily.   Yes Historical Provider, MD  Ledipasvir-Sofosbuvir (HARVONI) 90-400 MG TABS Take 1 tablet by mouth daily. 04/05/16   Gardiner Barefootobert W Ricco Dershem, MD    No Known  Allergies  Social History  Substance Use Topics  . Smoking status: Never Smoker  . Smokeless tobacco: Never Used  . Alcohol use 1.8 oz/week    3 Glasses of wine per week    FMHx: no cirrhosis, no liver cancer   Objective:  Constitutional: in no apparent distress and alert,  Vitals:   04/05/16 1535  BP: (!) 180/96  Pulse: 60  Temp: 97.8 F (36.6 C)   Eyes: anicteric Cardiovascular: Cor RRR Respiratory: CTA B; normal respiratory effort Gastrointestinal: Bowel sounds are normal, liver is not enlarged, spleen is not enlarged Musculoskeletal: no pedal edema noted Skin: negatives: no rash; no porphyria cutanea tarda Lymphatic: no cervical lymphadenopathy   Laboratory Genotype: No results found for: HCVGENOTYPE HCV viral load: No results found for: HCVQUANT Lab Results  Component Value Date   WBC 5.4 03/22/2016   HGB 11.0 (L) 03/22/2016   HCT 31.5 (L) 03/22/2016   MCV 97.5 03/22/2016   PLT 198 03/22/2016    Lab Results  Component Value Date   CREATININE 1.28 (H) 03/22/2016   BUN 24 03/22/2016   NA 128 (L) 03/22/2016   K 3.5 03/22/2016   CL 95 (L) 03/22/2016   CO2 23 03/22/2016    Lab Results  Component Value Date   ALT 148 (H) 03/22/2016   AST 217 (H) 03/22/2016   ALKPHOS 120 03/22/2016     Labs and history reviewed and show CHILD-PUGH A  5-6 points: Child class A 7-9 points: Child class B 10-15 points: Child class C  Lab Results  Component Value Date  INR 1.2 (H) 03/22/2016   BILITOT 0.9 03/22/2016   ALBUMIN 3.9 03/22/2016     Assessment: New Patient with Chronic Hepatitis C genotype 1a, untreated.  I discussed with the patient the lab findings that confirm chronic hepatitis C as well as the natural history and progression of disease including about 30% of people who develop cirrhosis of the liver if left untreated and once cirrhosis is established there is a 2-7% risk per year of liver cancer and liver failure.  I discussed the importance of  treatment and benefits in reducing the risk, even if significant liver fibrosis exists.   Plan: 1) Patient counseled extensively on limiting acetaminophen to no more than 2 grams daily, avoidance of alcohol. 2) Transmission discussed with patient including sexual transmission, sharing razors and toothbrush.   3) Will need referral to gastroenterology if concern for cirrhosis 4) Will need referral for substance abuse counseling: No.; Further work up to include urine drug screen  No. 5) Will prescribe Harvoni for 12 weeks 6) Hepatitis A vaccine No. 7) Hepatitis B vaccine No. 8) Pneumovax vaccine if concern for cirrhosis 9) Further work up to include liver staging with elastography 10) will follow up after starting medication

## 2016-04-05 NOTE — Patient Instructions (Signed)
Date 04/05/16  Dear Mrs Patricia Hunt, As discussed in the ID Clinic, your hepatitis C therapy will include the following medications:          Harvoni 90mg /400mg  tablet:           Take 1 tablet by mouth once daily   Please note that ALL MEDICATIONS WILL START ON THE SAME DATE for a total of 12 weeks. ---------------------------------------------------------------- Your HCV Treatment Start Date: TBA   Your HCV genotype:  1a    Liver Fibrosis: TBD    ---------------------------------------------------------------- YOUR PHARMACY CONTACT:   [facility] [address] Phone: [phone] Hours: Monday to Friday 7:30 am to 6:00 pm   Please always contact your pharmacy at least 3-4 business days before you run out of medications to ensure your next month's medication is ready or 1 week prior to running out if you receive it by mail.  Remember, each prescription is for 28 days. ---------------------------------------------------------------- GENERAL NOTES REGARDING YOUR HEPATITIS C MEDICATION:  SOFOSBUVIR/LEDIPASVIR (HARVONI): - Harvoni tablet is taken daily with OR without food. - The tablets are orange. - The tablets should be stored at room temperature.  - Acid reducing agents such as H2 blockers (ie. Pepcid (famotidine), Zantac (ranitidine), Tagamet (cimetidine), Axid (nizatidine) and proton pump inhibitors (ie. Prilosec (omeprazole), Protonix (pantoprazole), Nexium (esomeprazole), or Aciphex (rabeprazole)) can decrease effectiveness of Harvoni. Do not take until you have discussed with a health care provider.    -Antacids that contain magnesium and/or aluminum hydroxide (ie. Milk of Magensia, Rolaids, Gaviscon, Maalox, Mylanta, an dArthritis Pain Formula)can reduce absorption of Harvoni, so take them at least 4 hours before or after Harvoni.  -Calcium carbonate (calcium supplements or antacids such as Tums, Caltrate,  Os-Cal)needs to be taken at least 4 hours hours before or after Harvoni.  -St. John's wort or any products that contain St. John's wort like some herbal supplements  Please inform the office prior to starting any of these medications.  - The common side effects associated with Harvoni include:      1. Fatigue      2. Headache      3. Nausea      4. Diarrhea      5. Insomnia  Please note that this only lists the most common side effects and is NOT a comprehensive list of the potential side effects of these medications. For more information, please review the drug information sheets that come with your medication package from the pharmacy.  ---------------------------------------------------------------- GENERAL HELPFUL HINTS ON HCV THERAPY: 1. Stay well-hydrated. 2. Notify the ID Clinic of any changes in your other over-the-counter/herbal or prescription medications. 3. If you miss a dose of your medication, take the missed dose as soon as you remember. Return to your regular time/dose schedule the next day.  4.  Do not stop taking your medications without first talking with your healthcare provider. 5.  You may take Tylenol (acetaminophen), as long as the dose is less than 2000 mg (OR no more than 4 tablets of the Tylenol Extra Strengths 500mg  tablet) in 24 hours. 6.  You will see our pharmacist-specialist within the first 2 weeks of starting your medication. 7.  You will need to obtain routine labs around week 4 and12 weeks after starting and then 3 to 6 months after finishing Harvoni.    Patricia RighterOMER, Patricia Beaulac, MD  [facility] [address] [place],  [place]  [address] [phone]

## 2016-04-11 ENCOUNTER — Other Ambulatory Visit: Payer: Self-pay

## 2016-04-11 MED ORDER — LEDIPASVIR-SOFOSBUVIR 90-400 MG PO TABS: 1.0000 | ORAL_TABLET | Freq: Every day | ORAL | 2 refills | Status: DC

## 2016-04-11 NOTE — Progress Notes (Signed)
Patient requires RX sent to specialty pharmacy. [facility] [place].  Casilda Carlsaylor Seville Brick, PharmD. PGY-2 Infectious Diseases Pharmacy Resident Pager: [phone] 04/11/2016, 10:17 AM

## 2016-04-13 ENCOUNTER — Encounter: Payer: Self-pay | Admitting: Pharmacy Technician

## 2016-05-04 ENCOUNTER — Ambulatory Visit: Payer: BLUE CROSS/BLUE SHIELD | Admitting: Pharmacist

## 2016-05-04 DIAGNOSIS — B182 Chronic viral hepatitis C: Secondary | ICD-10-CM

## 2016-05-04 MED ORDER — ONDANSETRON 8 MG PO TBDP: 8.0000 mg | ORAL_TABLET | Freq: Three times a day (TID) | ORAL | 0 refills | Status: DC | PRN

## 2016-05-04 NOTE — Progress Notes (Signed)
HPI: Patricia BarefootKandie Hunt is a 60 y.o. female who presents to the [facility] pharmacy clinic for follow-up of her Hep C infection.  She has genotype 1a and started Harvoni on 04/14/16.   No results found for: HCVGENOTYPE, HEPCGENOTYPE  Allergies: No Known Allergies  Past Medical History: No past medical history on file.  Social History: Social History   Social History  . Marital status: Married    Spouse name: N/A  . Number of children: N/A  . Years of education: N/A   Social History Main Topics  . Smoking status: Never Smoker  . Smokeless tobacco: Never Used  . Alcohol use 1.8 oz/week    3 Glasses of wine per week  . Drug use: No  . Sexual activity: Not on file   Other Topics Concern  . Not on file   Social History Narrative  . No narrative on file    Labs: Hep B S Ab (no units)  Date Value  03/22/2016 POS (A)   Hepatitis B Surface Ag (no units)  Date Value  03/22/2016 NEGATIVE    No results found for: HCVGENOTYPE, HEPCGENOTYPE  No flowsheet data found.  AST (U/L)  Date Value  03/22/2016 217 (H)   ALT (U/L)  Date Value  03/22/2016 148 (H)  03/22/2016 147 (H)   INR (no units)  Date Value  03/22/2016 1.2 (H)    CrCl: CrCl cannot be calculated (Unknown ideal weight.).  Fibrosis Score: F4 as assessed by fibrosure   Child-Pugh Score: A  Previous Treatment Regimen: None  Assessment: Patricia Hunt is here for follow-up of her Hep C. She started her Harvoni around the first of the month and just started her third week.  She is complaining of extreme exhaustion and nausea associated with her Harvoni.  She tells me she usually has to lay down for a nap after she eats lunch.  She is asking me if she can take Pepto to help with the nausea.  There isn't much data on using Pepto with Harvoni, so I told her to make sure she separates the two.  She takes her Harvoni around 5pm, and I told her to take the Pepto around 2 hours before. I sent in Zofran to her local pharmacy in  case she needs something stronger. She tells me she hasn't missed any doses. I encouraged her to power through the fatigue and not miss any doses. She agrees.  We will get a Hep C VL today, and I made a f/u apt with Dr. Luciana Axeomer for end of treatment.   Plans: - Continue Harvoni x 12 weeks - Zofran ODT 8 mg - take 1 tablet PO q8h PRN nausea sent to pharmacy - F/u with Dr. Luciana Axeomer 12/7 at 11am  Patricia Hunt, BS, PharmD Infectious Diseases Clinical Pharmacist [facility] 05/04/2016, 11:06 AM

## 2016-05-05 LAB — HEPATITIS C RNA QUANTITATIVE: HCV Quantitative: NOT DETECTED IU/mL (ref <15)

## 2016-05-25 ENCOUNTER — Ambulatory Visit (HOSPITAL_COMMUNITY)
Admission: RE | Admit: 2016-05-25 | Discharge: 2016-05-25 | Disposition: A | Discharge status: Home / Self Care | Payer: BLUE CROSS/BLUE SHIELD | Source: Ambulatory Visit | Attending: Internal Medicine | Admitting: Internal Medicine

## 2016-05-25 DIAGNOSIS — B182 Chronic viral hepatitis C: Secondary | ICD-10-CM | POA: Diagnosis not present

## 2016-05-25 DIAGNOSIS — R932 Abnormal findings on diagnostic imaging of liver and biliary tract: Secondary | ICD-10-CM | POA: Diagnosis not present

## 2016-05-30 ENCOUNTER — Other Ambulatory Visit: Payer: Self-pay | Admitting: Pharmacist

## 2016-05-30 DIAGNOSIS — B182 Chronic viral hepatitis C: Secondary | ICD-10-CM

## 2016-05-30 MED ORDER — ONDANSETRON 8 MG PO TBDP: 8.0000 mg | ORAL_TABLET | Freq: Three times a day (TID) | ORAL | 1 refills | Status: DC | PRN

## 2016-07-20 ENCOUNTER — Ambulatory Visit: Payer: BLUE CROSS/BLUE SHIELD | Admitting: Internal Medicine

## 2016-08-01 ENCOUNTER — Encounter: Payer: Self-pay | Admitting: Internal Medicine

## 2016-08-01 ENCOUNTER — Ambulatory Visit (INDEPENDENT_AMBULATORY_CARE_PROVIDER_SITE_OTHER): Payer: BLUE CROSS/BLUE SHIELD | Admitting: Internal Medicine

## 2016-08-01 VITALS — BP 175/102 | HR 67 | Temp 98.6°F | Ht 60.0 in | Wt 101.0 lb

## 2016-08-01 DIAGNOSIS — B182 Chronic viral hepatitis C: Secondary | ICD-10-CM

## 2016-08-01 DIAGNOSIS — R740 Nonspecific elevation of levels of transaminase and lactic acid dehydrogenase [LDH]: Secondary | ICD-10-CM | POA: Diagnosis not present

## 2016-08-01 DIAGNOSIS — K7460 Unspecified cirrhosis of liver: Secondary | ICD-10-CM | POA: Diagnosis not present

## 2016-08-01 DIAGNOSIS — R7401 Elevation of levels of liver transaminase levels: Secondary | ICD-10-CM

## 2016-08-01 LAB — COMPLETE METABOLIC PANEL WITH GFR
ALBUMIN: 3.9 g/dL (ref 3.6–5.1)
ALK PHOS: 84 U/L (ref 33–130)
ALT: 18 U/L (ref 6–29)
AST: 39 U/L — AB (ref 10–35)
BUN: 14 mg/dL (ref 7–25)
CO2: 23 mmol/L (ref 20–31)
CREATININE: 1.27 mg/dL — AB (ref 0.50–0.99)
Calcium: 9.3 mg/dL (ref 8.6–10.4)
Chloride: 95 mmol/L — ABNORMAL LOW (ref 98–110)
GFR, Est African American: 53 mL/min — ABNORMAL LOW (ref >=60)
GFR, Est Non African American: 46 mL/min — ABNORMAL LOW (ref >=60)
GLUCOSE: 91 mg/dL (ref 65–99)
POTASSIUM: 3.4 mmol/L — AB (ref 3.5–5.3)
SODIUM: 131 mmol/L — AB (ref 135–146)
Total Bilirubin: 1.4 mg/dL — ABNORMAL HIGH (ref 0.2–1.2)
Total Protein: 8.8 g/dL — ABNORMAL HIGH (ref 6.1–8.1)

## 2016-08-01 NOTE — Progress Notes (Signed)
   Subjective:    Patient ID: Patricia Hunt, female    DOB: Dec 20, 1955, 60 y.o.   MRN: [id number]  HPI Here for follow up of HCV.  Has genotype 1a, started and now completed Harvoni.  Had significant fatigue during treatment and still having some fatigue but pleased to be done.  No headaches.  Had elastography and F3/4, cirrhosis noted on ultrasound.  Has had a recent pneumovax per her report by her PCP.  Early lab showed an undetectable hcv virus.  Hepatitis A and B immune.    Review of Systems  Constitutional: Positive for fatigue.  Gastrointestinal: Negative for diarrhea.  Skin: Negative for rash.  Neurological: Negative for headaches.       Objective:   Physical Exam  Constitutional: She appears well-developed and well-nourished. No distress.  Eyes: No scleral icterus.  Cardiovascular: Normal rate, regular rhythm and normal heart sounds.   Skin: No rash noted.   SH: no alcohol       Assessment & Plan:

## 2016-08-01 NOTE — Assessment & Plan Note (Signed)
No alcohol.  I will refer to GI in [place] for ?EGD and potentially can get HCC screening there as well going forward.   Has had pneumovax by her report

## 2016-08-01 NOTE — Assessment & Plan Note (Signed)
Doing good.  EOT lab today and rtc 6 months for SVR 24.

## 2016-08-01 NOTE — Assessment & Plan Note (Signed)
Likely hep c related.  I will check today to see if it is resolved.

## 2016-08-03 ENCOUNTER — Telehealth: Payer: Self-pay | Admitting: *Deleted

## 2016-08-03 LAB — HEPATITIS C RNA QUANTITATIVE: HCV Quantitative: NOT DETECTED IU/mL (ref <15)

## 2016-08-03 NOTE — Telephone Encounter (Signed)
Patietn [place] to Dr [place] at [place] request.  Office notes, labs, and images faxed to [phone], [place].  They will call to schedule. Patient is to follow up here for SVR in 6 months. Andree CossHowell, Michelle M, RN

## 2016-08-11 ENCOUNTER — Telehealth: Payer: Self-pay | Admitting: *Deleted

## 2016-08-11 NOTE — Telephone Encounter (Signed)
Dr. Silvana NewnessButler's office has tried multiple times to reach the patient, unable to leave a message. RN called patient, notified her.  She will call Dr Silvana NewnessButler's office today.  Lab results relayed to patient. Andree CossHowell, Ah Bott M, RN

## 2017-10-08 IMAGING — US US ABDOMEN COMPLETE W/ ELASTOGRAPHY
2 series · 12 of 25 positions shown · non-contrast
Comparison: 03/17/2014 abdominal sonogram. 08/16/2003 CT
abdomen/pelvis.

CLINICAL DATA: Chronic hepatitis-C.



[Series 1: us abdomen complete w/ elastography · 0.17mm/px · 11 of 86 slices shown (1 of 2)]
[im 5/86]
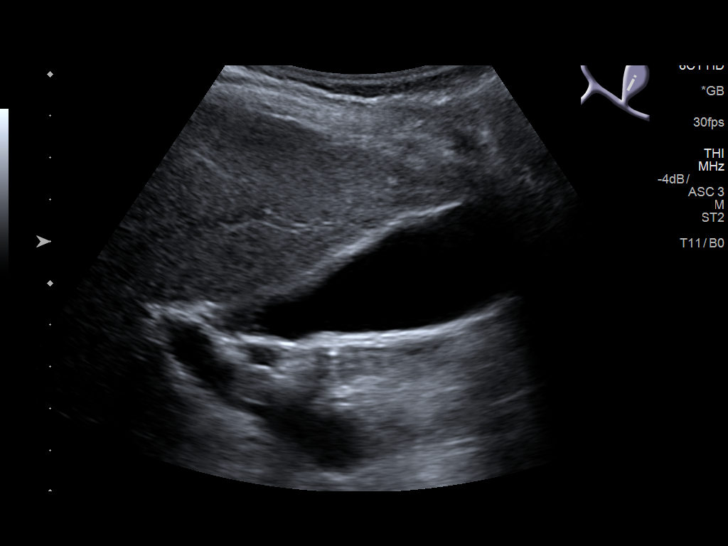
[im 13/86]
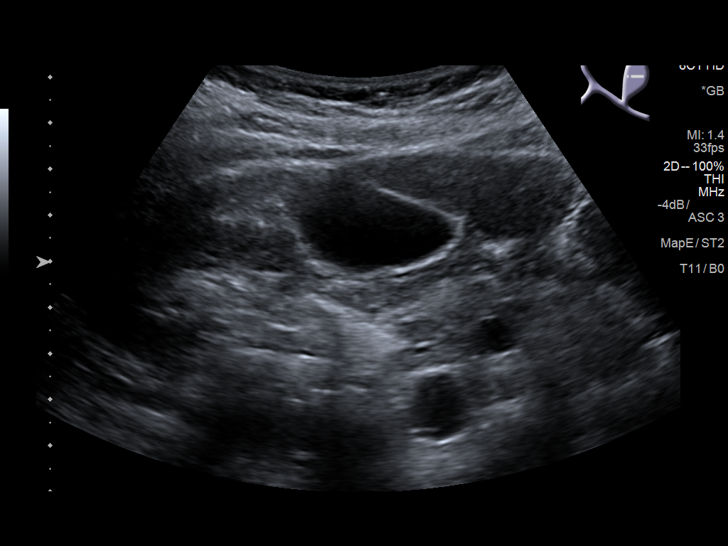
[im 21/86]
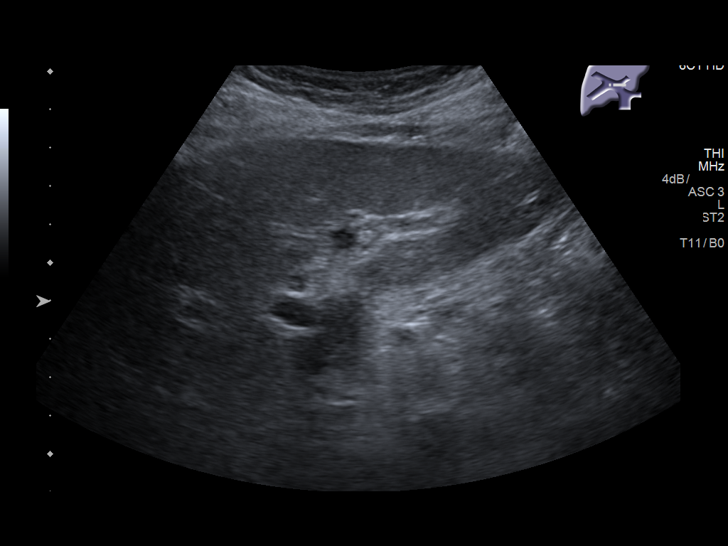
[im 29/86]
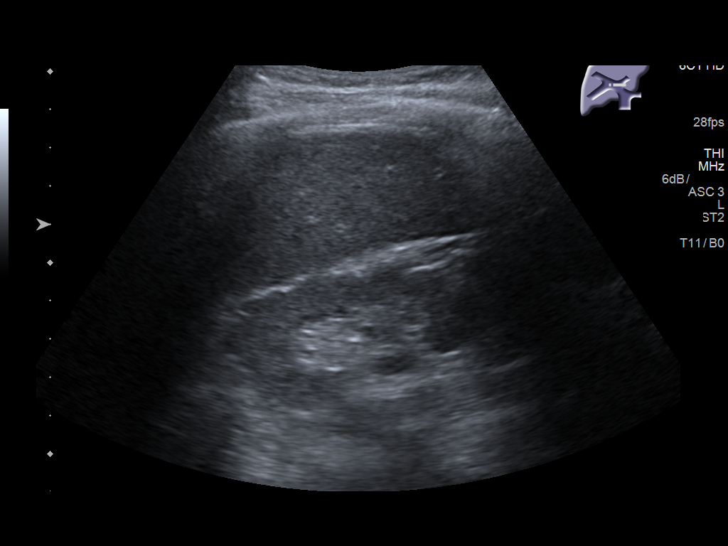
[im 37/86]
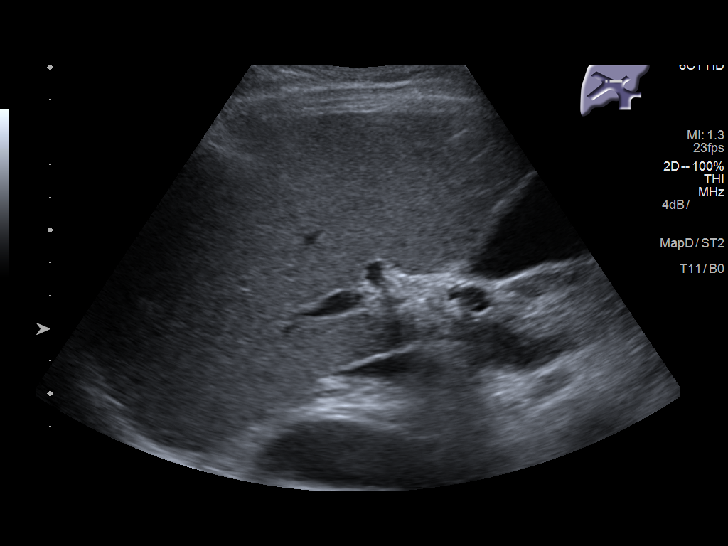
[im 45/86]
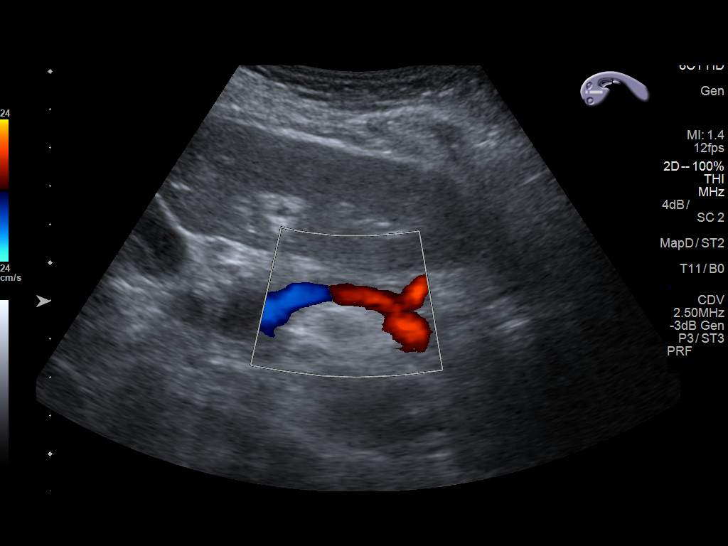
[im 53/86]
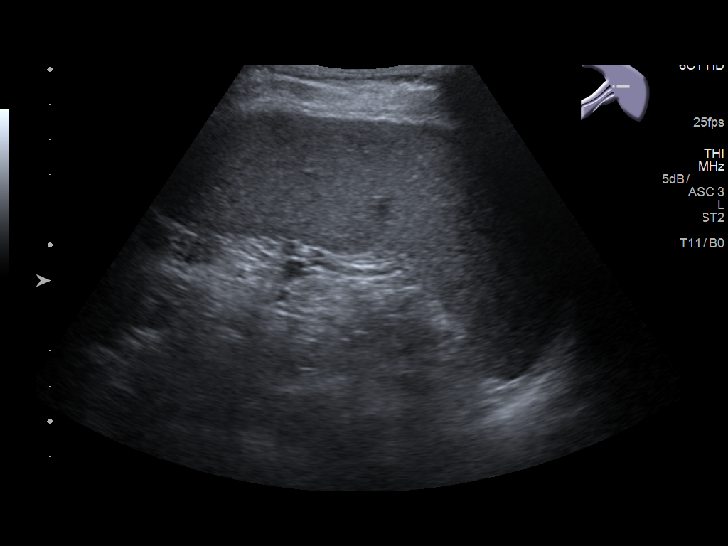
[im 61/86]
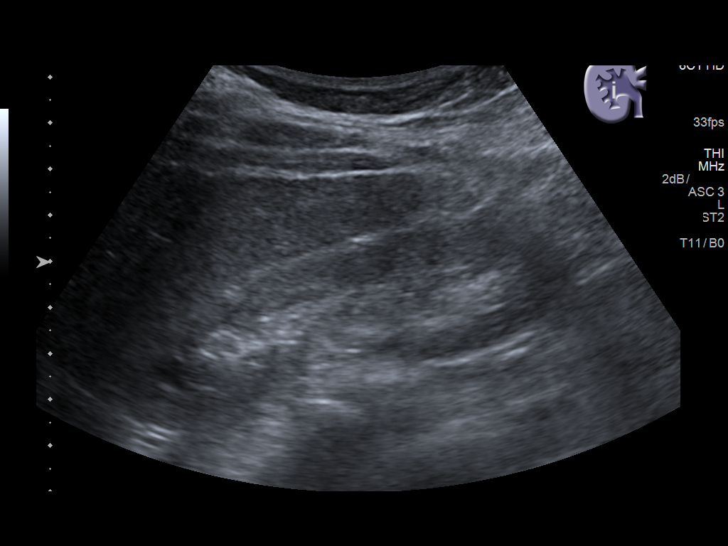
[im 69/86]
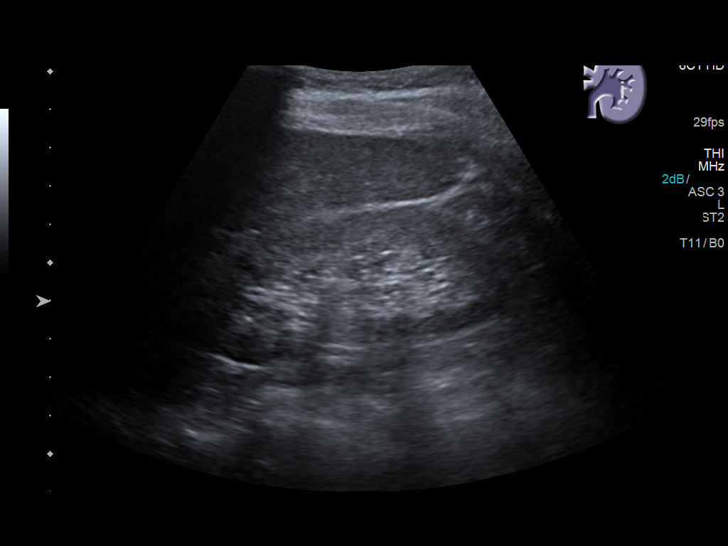
[im 77/86]
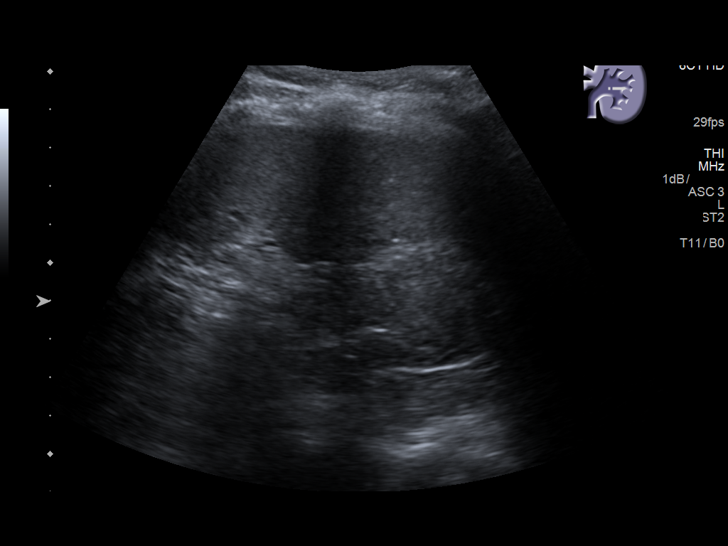
[im 86/86]
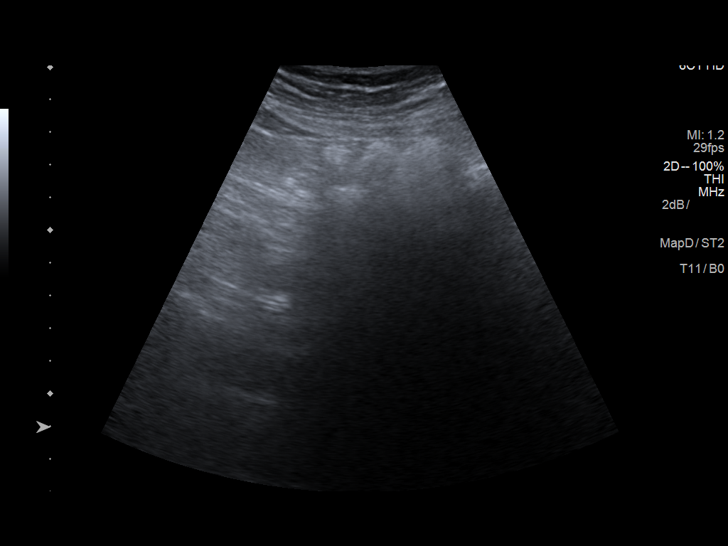

[Series 2001: us abdomen complete w/ elastography · 0.14mm/px · 1 of 13 slices shown (2 of 2)]
[im 7/13]
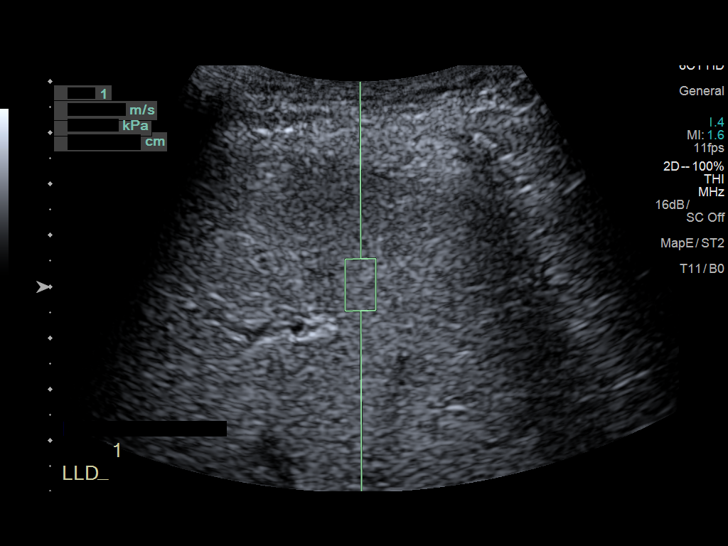

[12 of 25 positions shown; findings below may reference images not displayed]

FINDINGS: ULTRASOUND ABDOMEN

Gallbladder: No gallstones or wall thickening visualized. No
sonographic Murphy sign noted by sonographer.

Common bile duct: Proximal common bile duct diameter 4 mm, stable.
Mild dilation of the mid portion of the common bile duct (8 mm
diameter), stable compared to 08/16/2003 CT study.

Liver: Liver parenchyma appears diffusely mildly coarsened in
echotexture. Liver surface appears finely irregular, suggesting
cirrhosis. No liver mass demonstrated.

IVC: No abnormality visualized.

Pancreas: Visualized portion unremarkable.

Spleen: Top-normal size spleen. Splenic dimensions 13.2 x 4.2 x
cm (splenic volume 337 cc). No splenic mass.

Right Kidney: Length: 9.7 cm. Echogenicity within normal limits. No
mass or hydronephrosis visualized.

Left Kidney: Length: 9.47. Echogenicity within normal limits. No
mass or hydronephrosis visualized.

Abdominal aorta: No aneurysm visualized.

Other findings: None.

ULTRASOUND HEPATIC ELASTOGRAPHY

Device: Siemens Helix VTQ

Patient position: Left Lateral Decubitus

Transducer 6C1

Number of measurements: 10

Hepatic segment:  8

Median velocity:   2.38  m/sec

IQR:

IQR/Median velocity ratio:

Corresponding Metavir fibrosis score:  Some F3 + F4

Risk of fibrosis: High

Limitations of exam: None

Pertinent findings noted on other imaging exams:  None

Please note that abnormal shear wave velocities may also be
identified in clinical settings other than with hepatic fibrosis,
such as: acute hepatitis, elevated right heart and central venous
pressures including use of beta blockers, Yesy disease
(Adenyo), infiltrative processes such as
mastocytosis/amyloidosis/infiltrative tumor, extrahepatic
cholestasis, in the post-prandial state, and liver transplantation.
Correlation with patient history, laboratory data, and clinical
condition recommended.
IMPRESSION: ULTRASOUND ABDOMEN:

1. Liver parenchyma is mildly coarsened in echotexture and liver
surface appears slightly irregular, suggesting cirrhosis. No liver
mass.
2. Top-normal size spleen.  No ascites.
3. Mild dilation of the midportion of the common bile duct, which
appears unchanged compared to the 0112 CT study. No intrahepatic
biliary ductal dilatation. No cholelithiasis. Recommend correlation
with serum bilirubin levels.
4. Otherwise normal abdominal sonogram.

ULTRASOUND HEPATIC ELASTOGRAPHY:

Median hepatic shear wave velocity is calculated at 2.38 m/sec.

Corresponding Metavir fibrosis score is  Some F3 + F4.

Risk of fibrosis is High.

Follow-up: Follow up advised

## 2022-02-22 ENCOUNTER — Other Ambulatory Visit: Payer: Self-pay | Admitting: Nurse Practitioner

## 2022-02-22 DIAGNOSIS — K7469 Other cirrhosis of liver: Secondary | ICD-10-CM

## 2022-08-30 ENCOUNTER — Other Ambulatory Visit: Payer: Self-pay

## 2022-08-30 DIAGNOSIS — K7469 Other cirrhosis of liver: Secondary | ICD-10-CM

## 2022-09-15 ENCOUNTER — Other Ambulatory Visit: Payer: Self-pay | Admitting: Nurse Practitioner

## 2022-09-15 DIAGNOSIS — K7469 Other cirrhosis of liver: Secondary | ICD-10-CM

## 2022-10-10 ENCOUNTER — Other Ambulatory Visit: Payer: BLUE CROSS/BLUE SHIELD

## 2024-05-21 ENCOUNTER — Other Ambulatory Visit: Payer: Self-pay | Admitting: Family Medicine

## 2024-05-21 DIAGNOSIS — Z1231 Encounter for screening mammogram for malignant neoplasm of breast: Secondary | ICD-10-CM

## 2024-05-27 ENCOUNTER — Ambulatory Visit
Admission: RE | Admit: 2024-05-27 | Discharge: 2024-05-27 | Disposition: A | Discharge status: Home / Self Care | Source: Ambulatory Visit | Attending: Family Medicine | Admitting: Family Medicine

## 2024-05-27 ENCOUNTER — Other Ambulatory Visit: Payer: Self-pay | Admitting: Family

## 2024-05-27 DIAGNOSIS — Z1231 Encounter for screening mammogram for malignant neoplasm of breast: Secondary | ICD-10-CM
